# Patient Record
Sex: Male | Born: 2000 | Race: White | Hispanic: No | Marital: Single | Smoking: Current some day smoker
Health system: Southern US, Community
[De-identification: ages and names within clinical notes are randomized; demographics above are authoritative.]

## PROBLEM LIST (undated history)

## (undated) DIAGNOSIS — J45909 Unspecified asthma, uncomplicated: Secondary | ICD-10-CM

## (undated) HISTORY — PX: NO PAST SURGERIES: SHX2092

---
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
## 2018-05-04 IMAGING — CR DG CHEST 2V
2 series · 3 of 3 positions shown · non-contrast
Comparison: Chest x-ray dated 01/13/2013.

CLINICAL DATA: Shortness of breath x 4 days. Hx of Chest pain 2
years ago but was told growing pain. Hx asthma with exercise

EXAM:
CHEST  2 VIEW

[Series 1: chest pa · 0.14mm/px · 2 of 2 slices shown]
[im 1/2]
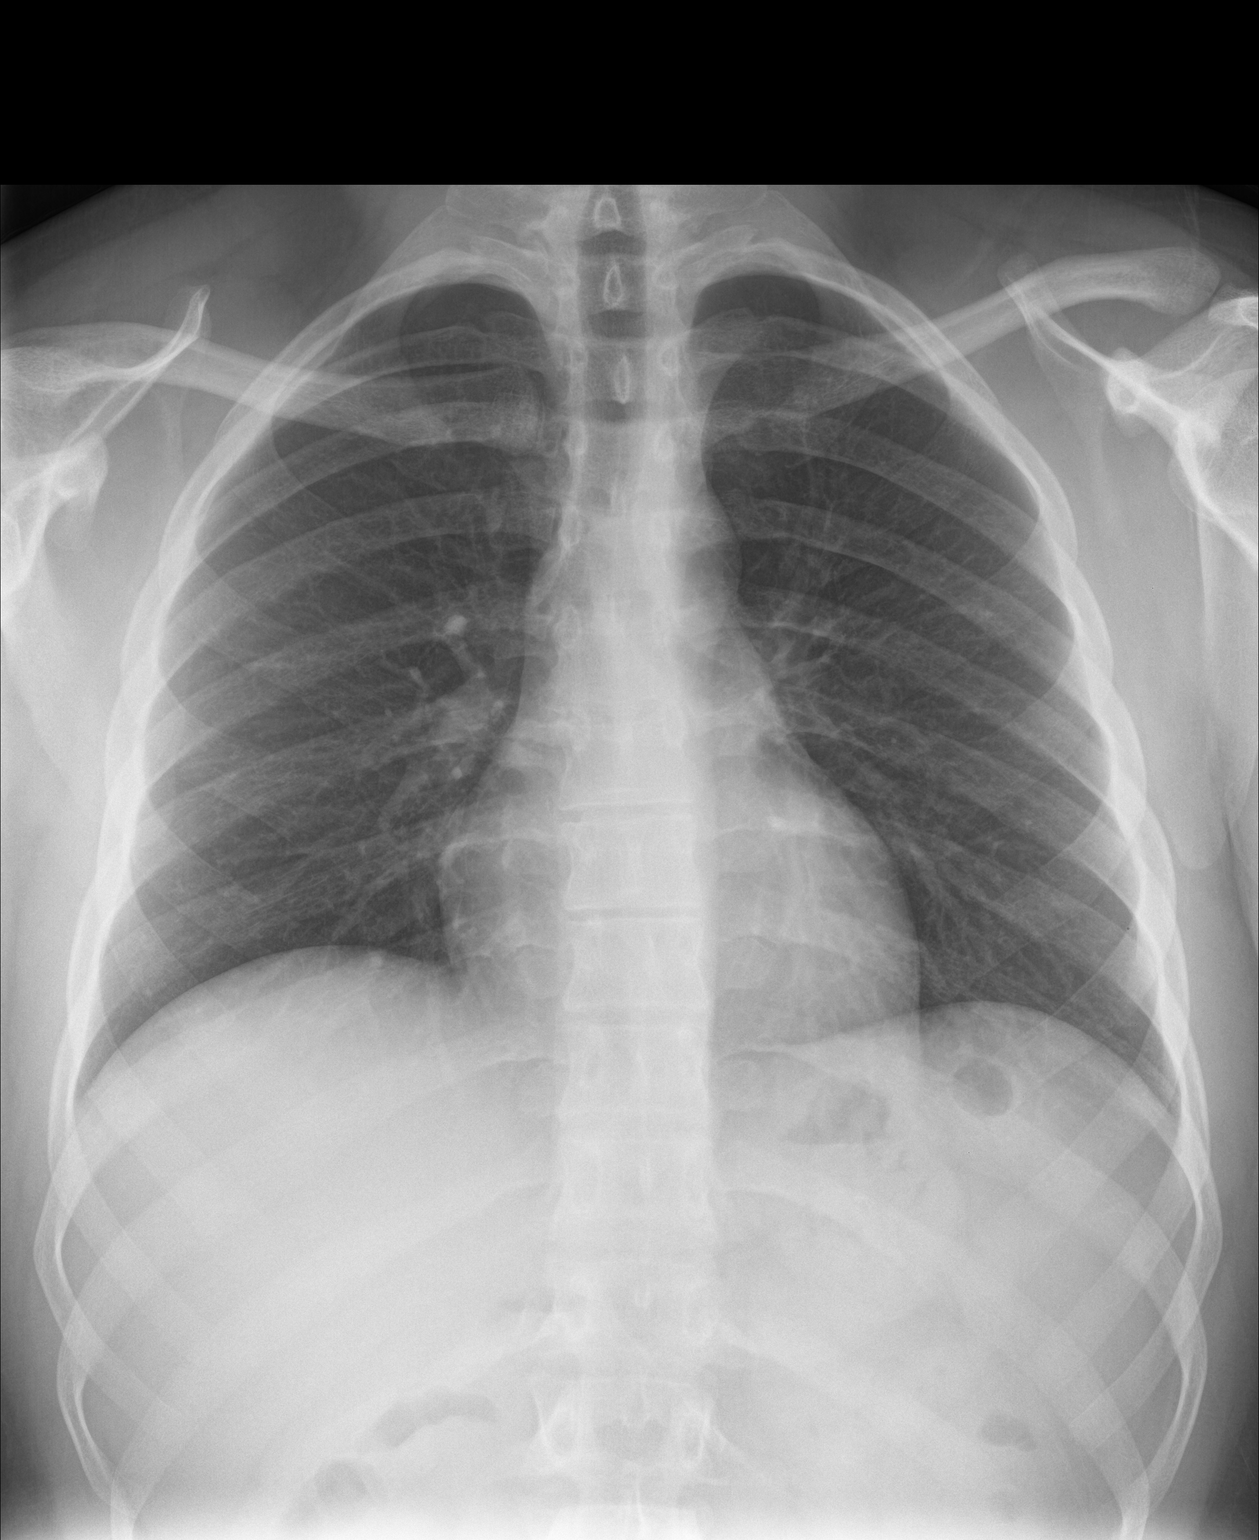
[im 2/2]
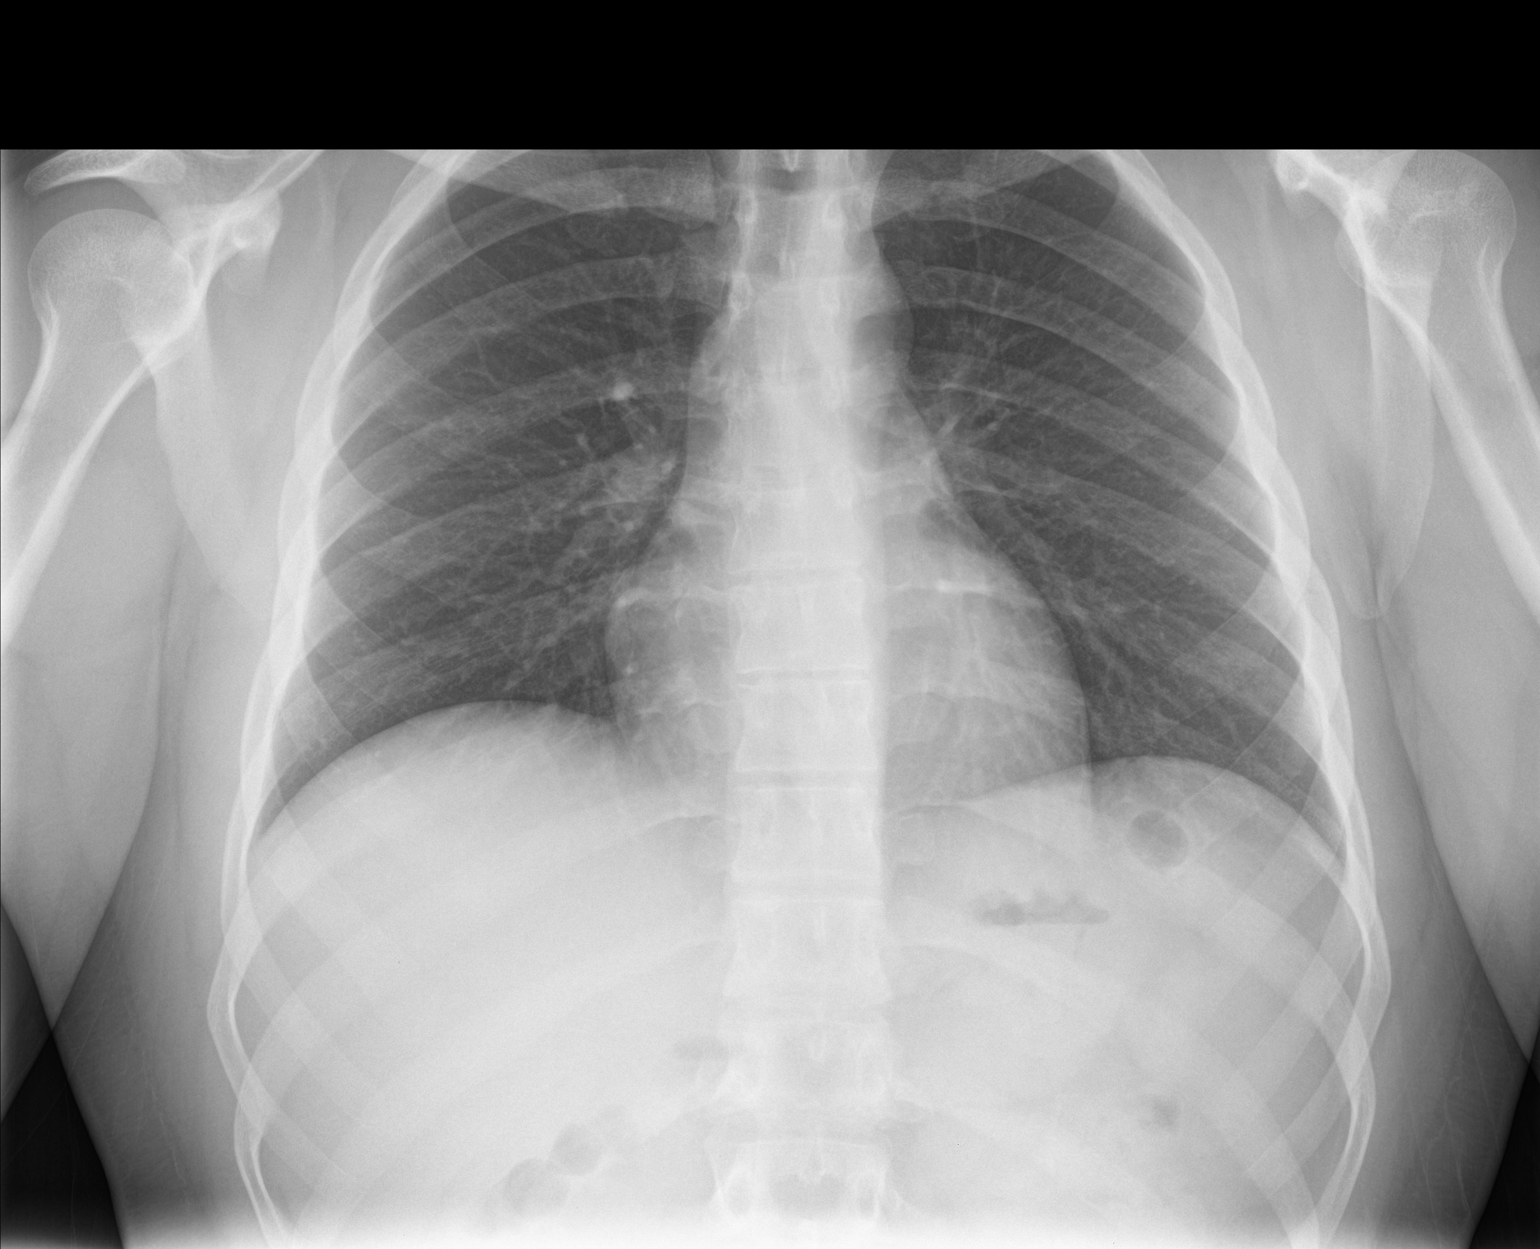

[chest lat]
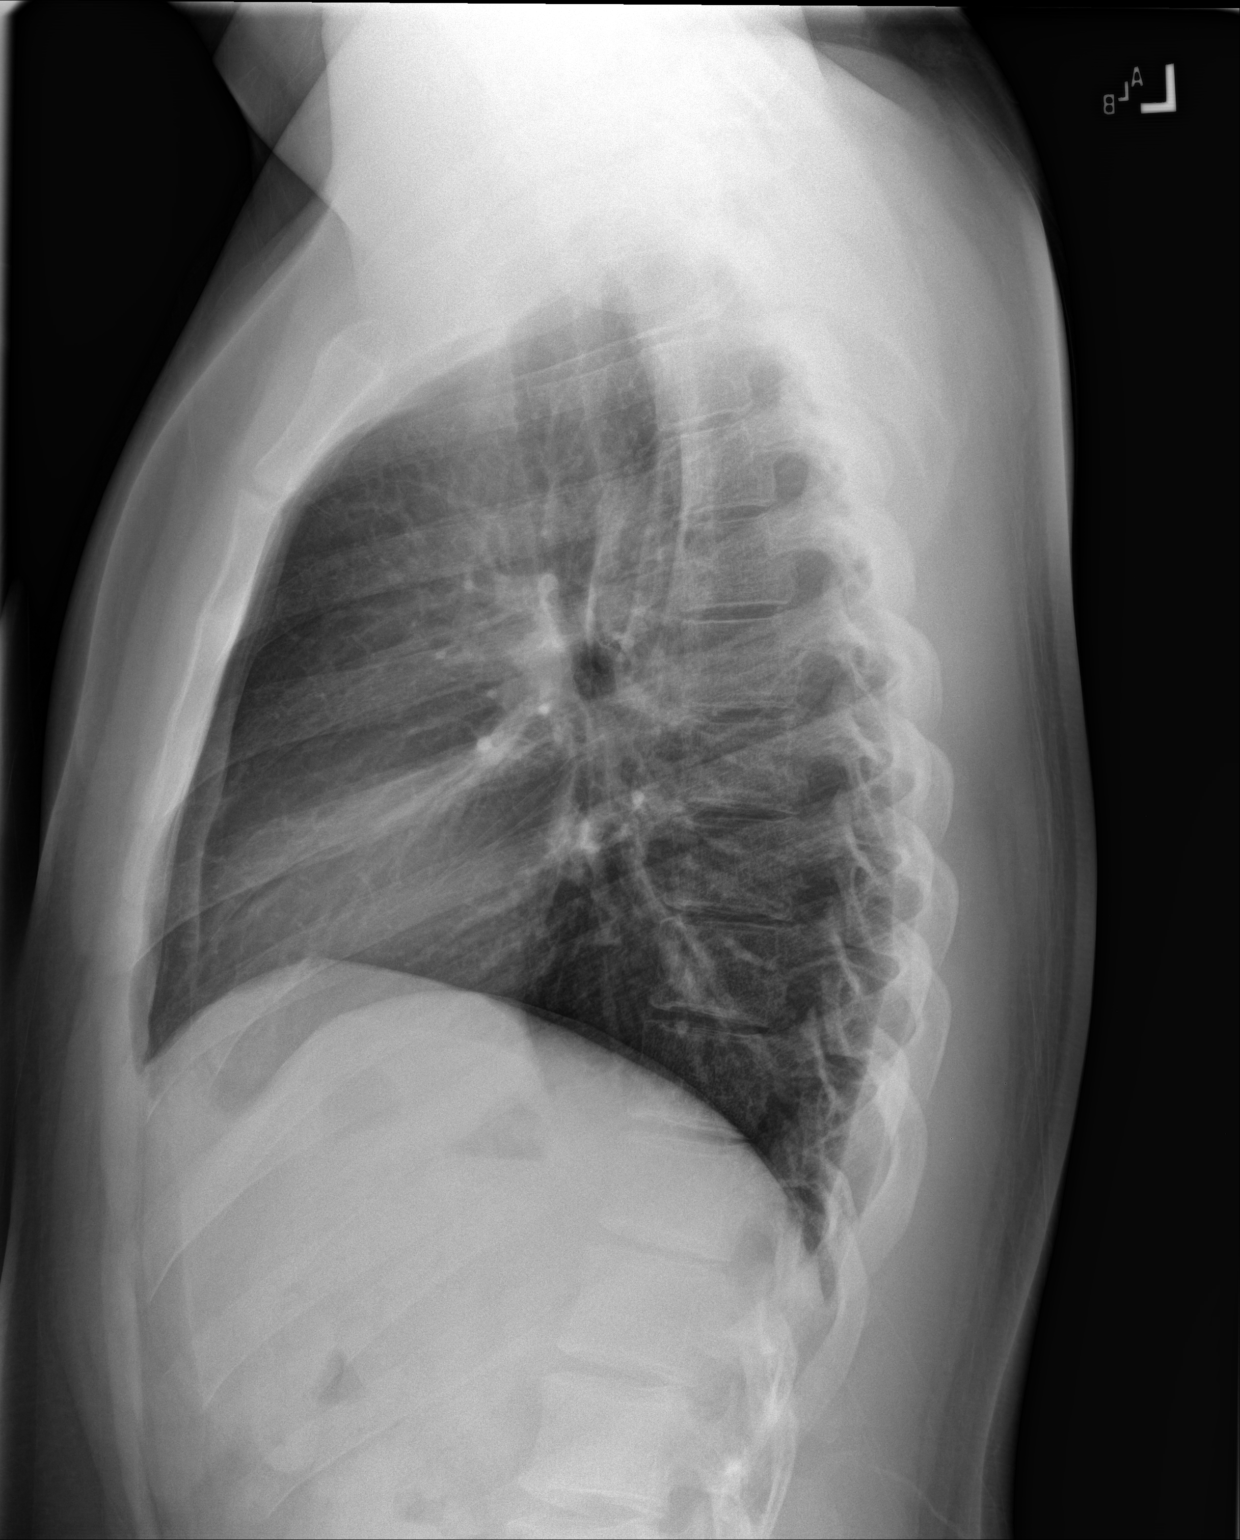

[3 of 3 positions shown; findings below may reference images not displayed]

FINDINGS: Cardiomediastinal silhouette is normal in size and configuration.
Lungs are clear. Lung volumes are normal. No evidence of pneumonia.
No pleural effusion. No pneumothorax seen.

Osseous and soft tissue structures about the chest are unremarkable.
IMPRESSION: Normal chest x-ray.

## 2018-12-12 ENCOUNTER — Ambulatory Visit
Admission: EM | Admit: 2018-12-12 | Discharge: 2018-12-12 | Disposition: A | Discharge status: Home / Self Care | Payer: Medicaid Other | Attending: Family Medicine | Admitting: Family Medicine

## 2018-12-12 ENCOUNTER — Encounter: Payer: Self-pay | Admitting: Emergency Medicine

## 2018-12-12 ENCOUNTER — Other Ambulatory Visit: Payer: Self-pay

## 2018-12-12 DIAGNOSIS — L03114 Cellulitis of left upper limb: Secondary | ICD-10-CM

## 2018-12-12 MED ORDER — SULFAMETHOXAZOLE-TRIMETHOPRIM 800-160 MG PO TABS: 1.0000 | ORAL_TABLET | Freq: Two times a day (BID) | ORAL | 0 refills | Status: DC

## 2018-12-12 NOTE — Discharge Instructions (Signed)
Elevate and warm compresses to area

## 2018-12-12 NOTE — ED Provider Notes (Signed)
[facility]    CSN: [id number] Arrival date & time: 12/12/18  1447     History   Chief Complaint Chief Complaint  Patient presents with  . Rash    HPI Dale Mckee is a 18 y.o. male.   18 yo male with a c/o left axilla rash, pain and left thumb and index finger rash and pain. Denies any fevers, chills.   The history is provided by the patient.  Rash    Past Medical History:  Diagnosis Date  . Asthma     There are no active problems to display for this patient.   Past Surgical History:  Procedure Laterality Date  . NO PAST SURGERIES         Home Medications    Prior to Admission medications   Medication Sig Start Date End Date Taking? Authorizing Provider  albuterol (PROVENTIL HFA;VENTOLIN HFA) 108 (90 Base) MCG/ACT inhaler Inhale 2 puffs into the lungs every 4 (four) hours as needed. 05/27/17   Renford Dills, NP  ondansetron (ZOFRAN ODT) 8 MG disintegrating tablet Take 1 tablet (8 mg total) by mouth every 8 (eight) hours as needed. 08/14/17   Payton Mccallum, MD  sulfamethoxazole-trimethoprim (BACTRIM DS,SEPTRA DS) 800-160 MG tablet Take 1 tablet by mouth 2 (two) times daily. 12/12/18   Payton Mccallum, MD    Family History Family History  Problem Relation Age of Onset  . Asthma Mother   . Hypertension Father   . CAD Father     Social History Social History   Tobacco Use  . Smoking status: Current Some Day Smoker    Packs/day: 0.50    Types: Cigarettes  . Smokeless tobacco: Current User  Substance Use Topics  . Alcohol use: No  . Drug use: No     Allergies   Patient has no known allergies.   Review of Systems Review of Systems  Skin: Positive for rash.     Physical Exam Triage Vital Signs ED Triage Vitals  Enc Vitals Group     BP 12/12/18 1518 (!) 141/99     Pulse Rate 12/12/18 1518 (!) 103     Resp 12/12/18 1518 18     Temp 12/12/18 1518 98.1 F (36.7 C)     Temp Source 12/12/18 1518 Oral     SpO2 12/12/18  1518 98 %     Weight 12/12/18 1515 225 lb (102.1 kg)     Height 12/12/18 1515 5\' 7"  (1.702 m)     Head Circumference --      Peak Flow --      Pain Score 12/12/18 1515 3     Pain Loc --      Pain Edu? --      Excl. in GC? --    No data found.  Updated Vital Signs BP (!) 141/99 (BP Location: Left Arm)   Pulse (!) 103   Temp 98.1 F (36.7 C) (Oral)   Resp 18   Ht 5\' 7"  (1.702 m)   Wt 102.1 kg   SpO2 98%   BMI 35.24 kg/m   Visual Acuity Right Eye Distance:   Left Eye Distance:   Bilateral Distance:    Right Eye Near:   Left Eye Near:    Bilateral Near:     Physical Exam Vitals signs and nursing note reviewed.  Constitutional:      General: Dale Mckee is not in acute distress.    Appearance: Dale Mckee is not toxic-appearing or diaphoretic.  Musculoskeletal:  Comments: Left axilla with area of 3-4cm  blanchable erythema, warmth and tenderness to palpation; 1cm area of erythema to left thumb and index finger; no blisters, vesicles, abscess or boils.   Neurological:     Mental Status: Dale Mckee is alert.      UC Treatments / Results  Labs (all labs ordered are listed, but only abnormal results are displayed) Labs Reviewed - No data to display  EKG None  Radiology No results found.  Procedures Procedures (including critical care time)  Medications Ordered in UC Medications - No data to display  Initial Impression / Assessment and Plan / UC Course  I have reviewed the triage vital signs and the nursing notes.  Pertinent labs & imaging results that were available during my care of the patient were reviewed by me and considered in my medical decision making (see chart for details).      Final Clinical Impressions(s) / UC Diagnoses   Final diagnoses:  Cellulitis of left arm     Discharge Instructions     Elevate and warm compresses to area    ED Prescriptions    Medication Sig Dispense Auth. Provider   sulfamethoxazole-trimethoprim (BACTRIM DS,SEPTRA DS) 800-160  MG tablet Take 1 tablet by mouth 2 (two) times daily. 20 tablet Payton Mccallumonty, Baleria Wyman, MD     1. diagnosis reviewed with patient 2. rx as per orders above; reviewed possible side effects, interactions, risks and benefits  3. Recommend supportive treatment as above 4. Follow-up prn if symptoms worsen or don't improve Controlled Substance Prescriptions [place] Controlled Substance Registry consulted? Not Applicable   Payton Mccallumonty, Taraji Mungo, MD 12/12/18 [phone]

## 2018-12-12 NOTE — ED Triage Notes (Signed)
Pt c/o rash in his axiliary region and on his fingers. Rash is tender but does not itch. He woke up yesterday and his arm was numb

## 2022-08-02 ENCOUNTER — Encounter: Payer: Self-pay | Admitting: Emergency Medicine

## 2022-08-02 ENCOUNTER — Ambulatory Visit
Admission: EM | Admit: 2022-08-02 | Discharge: 2022-08-02 | Disposition: A | Discharge status: Home / Self Care | Payer: Medicaid Other | Attending: Physician Assistant | Admitting: Physician Assistant

## 2022-08-02 DIAGNOSIS — R112 Nausea with vomiting, unspecified: Secondary | ICD-10-CM

## 2022-08-02 DIAGNOSIS — J029 Acute pharyngitis, unspecified: Secondary | ICD-10-CM | POA: Diagnosis not present

## 2022-08-02 DIAGNOSIS — Z1152 Encounter for screening for COVID-19: Secondary | ICD-10-CM | POA: Insufficient documentation

## 2022-08-02 LAB — RESP PANEL BY RT-PCR (FLU A&B, COVID) ARPGX2
Influenza A by PCR: NEGATIVE
Influenza B by PCR: NEGATIVE
SARS Coronavirus 2 by RT PCR: NEGATIVE

## 2022-08-02 LAB — GROUP A STREP BY PCR: Group A Strep by PCR: NOT DETECTED

## 2022-08-02 MED ORDER — ONDANSETRON 4 MG PO TBDP: 4.0000 mg | ORAL_TABLET | Freq: Three times a day (TID) | ORAL | 0 refills | Status: DC | PRN

## 2022-08-02 MED ORDER — CETIRIZINE HCL 10 MG PO TABS: 10.0000 mg | ORAL_TABLET | Freq: Every day | ORAL | 0 refills | Status: AC

## 2022-08-02 NOTE — Discharge Instructions (Addendum)
-  Swabs for strep throat, COVID, and flu were negative. -Symptoms likely seasonal allergy related from drainage.  As below Flonase and Zofran can help with the drainage and congestion which will help with the sore throat and the vomiting most likely. -Zofran: Paste 1 tablet under the tongue every 8 hours allowed to dissolve as needed for nausea -Push fluids -Bland diet as tolerated.  Additional information attached -Zyrtec: 1 tablet daily.  This will help with the congestion and drainage that may be at least partially cause for your sore throat.  Vomiting may be related to drainage as well. -Can also use Flonase to help with congestion symptoms. -Follow-up with primary care as needed

## 2022-08-02 NOTE — ED Provider Notes (Signed)
[facility]    CSN: [id number] Arrival date & time: 08/02/22  1427      History   Chief Complaint Chief Complaint  Patient presents with   Emesis   Sore Throat    HPI Dale Mckee is a 21 y.o. male.   Patient is a 21 year old male who presents with chief complaint of 1 week of sore throat.  Additionally patient states he has woke up with swollen uvula had some vomiting last night.  States he last tried to eat something this morning.  Patient reported some nausea as well as cold sweats.  He states he has not taken anything for his symptomshad a swollen uvula.  Patient reports some sick contacts at work but not sure what they have.  Patient denies any fever, chest pain, or shortness of breath.    Past Medical History:  Diagnosis Date   Asthma     There are no problems to display for this patient.   Past Surgical History:  Procedure Laterality Date   NO PAST SURGERIES         Home Medications    Prior to Admission medications   Medication Sig Start Date End Date Taking? Authorizing Provider  cetirizine (ZYRTEC) 10 MG tablet Take 1 tablet (10 mg total) by mouth daily. 08/02/22  Yes Candis Schatz, PA-C  ondansetron (ZOFRAN-ODT) 4 MG disintegrating tablet Take 1 tablet (4 mg total) by mouth every 8 (eight) hours as needed for nausea or vomiting. 08/02/22  Yes Candis Schatz, PA-C  albuterol (PROVENTIL HFA;VENTOLIN HFA) 108 (90 Base) MCG/ACT inhaler Inhale 2 puffs into the lungs every 4 (four) hours as needed. 05/27/17   Renford Dills, NP  ondansetron (ZOFRAN ODT) 8 MG disintegrating tablet Take 1 tablet (8 mg total) by mouth every 8 (eight) hours as needed. 08/14/17   Payton Mccallum, MD  sulfamethoxazole-trimethoprim (BACTRIM DS,SEPTRA DS) 800-160 MG tablet Take 1 tablet by mouth 2 (two) times daily. 12/12/18   Payton Mccallum, MD    Family History Family History  Problem Relation Age of Onset   Asthma Mother    Hypertension Father    CAD  Father     Social History Social History   Tobacco Use   Smoking status: Some Days    Packs/day: 0.50    Types: Cigarettes   Smokeless tobacco: Current  Vaping Use   Vaping Use: Every day  Substance Use Topics   Alcohol use: No   Drug use: Yes    Types: Marijuana     Allergies   Patient has no known allergies.   Review of Systems Review of Systems as noted above in HPI.  Other systems reviewed and found to be negative   Physical Exam Triage Vital Signs ED Triage Vitals  Enc Vitals Group     BP 08/02/22 1454 123/72     Pulse Rate 08/02/22 1454 (!) 103     Resp 08/02/22 1454 16     Temp 08/02/22 1454 98.8 F (37.1 C)     Temp Source 08/02/22 1454 Oral     SpO2 08/02/22 1454 98 %     Weight 08/02/22 1453 225 lb 1.4 oz (102.1 kg)     Height 08/02/22 1453 5\' 7"  (1.702 m)     Head Circumference --      Peak Flow --      Pain Score 08/02/22 1451 6     Pain Loc --      Pain Edu? --  Excl. in GC? --    No data found.  Updated Vital Signs BP 123/72 (BP Location: Left Arm)   Pulse (!) 103   Temp 98.8 F (37.1 C) (Oral)   Resp 16   Ht 5\' 7"  (1.702 m)   Wt 225 lb 1.4 oz (102.1 kg)   SpO2 98%   BMI 35.25 kg/m   Visual Acuity Right Eye Distance:   Left Eye Distance:   Bilateral Distance:    Right Eye Near:   Left Eye Near:    Bilateral Near:     Physical Exam Constitutional:      Appearance: He is well-developed. He is not ill-appearing.  HENT:     Head: Normocephalic.     Right Ear: Tympanic membrane and ear canal normal.     Left Ear: Tympanic membrane and ear canal normal.     Mouth/Throat:     Pharynx: Uvula midline. Posterior oropharyngeal erythema (mild) present.     Tonsils: 2+ on the right. 2+ on the left.     Comments: Clear post-nasal drainage Eyes:     Conjunctiva/sclera: Conjunctivae normal.  Cardiovascular:     Rate and Rhythm: Normal rate and regular rhythm.  Pulmonary:     Effort: Pulmonary effort is normal.     Breath  sounds: Normal breath sounds. No wheezing or rhonchi.  Abdominal:     Palpations: Abdomen is soft.  Musculoskeletal:     Cervical back: Normal range of motion.  Neurological:     Mental Status: He is alert.      UC Treatments / Results  Labs (all labs ordered are listed, but only abnormal results are displayed) Labs Reviewed  GROUP A STREP BY PCR  RESP PANEL BY RT-PCR (FLU A&B, COVID) [id number]    EKG   Radiology No results found.  Procedures Procedures (including critical care time)  Medications Ordered in UC Medications - No data to display  Initial Impression / Assessment and Plan / UC Course  I have reviewed the triage vital signs and the nursing notes.  Pertinent labs & imaging results that were available during my care of the patient were reviewed by me and considered in my medical decision making (see chart for details).    Patient presents with 1 week of sore throat, vomiting started last night as well as some cold sweats.  Patient also reports occasional waking with a swollen uvula.  Patient denies any fever, chest pains, shortness of breath.  Patient has not taken anything for his symptoms.  Will check a strep and viral swabs.  Viral panel and strep swabs were negative.  Give patient prescription for Zofran for his nausea.  Also given prescription for Zyrtec taking 1 daily to help with the drainage.  Also recommend Flonase.  Recommend pushing fluids and bland diet as tolerated Final Clinical Impressions(s) / UC Diagnoses   Final diagnoses:  Nausea and vomiting, unspecified vomiting type  Sore throat     Discharge Instructions      -Swabs for strep throat, COVID, and flu were negative. -Symptoms likely seasonal allergy related from drainage.  As below Flonase and Zofran can help with the drainage and congestion which will help with the sore throat and the vomiting most likely. -Zofran: Paste 1 tablet under the tongue every 8 hours allowed to dissolve as  needed for nausea -Push fluids -Bland diet as tolerated.  Additional information attached -Zyrtec: 1 tablet daily.  This will help with the congestion and drainage that may  be at least partially cause for your sore throat.  Vomiting may be related to drainage as well. -Can also use Flonase to help with congestion symptoms. -Follow-up with primary care as needed     ED Prescriptions     Medication Sig Dispense Auth. Provider   ondansetron (ZOFRAN-ODT) 4 MG disintegrating tablet Take 1 tablet (4 mg total) by mouth every 8 (eight) hours as needed for nausea or vomiting. 20 tablet Candis Schatz, PA-C   cetirizine (ZYRTEC) 10 MG tablet Take 1 tablet (10 mg total) by mouth daily. 30 tablet Candis Schatz, PA-C      PDMP not reviewed this encounter.   Candis Schatz, PA-C 08/02/22 1543

## 2022-08-02 NOTE — ED Triage Notes (Signed)
Pt c/o nausea, vomiting, sore throat. Sore throat started a couple of days ago and vomiting started last night. Denies fever.

## 2022-08-24 ENCOUNTER — Ambulatory Visit
Admission: EM | Admit: 2022-08-24 | Discharge: 2022-08-24 | Disposition: A | Discharge status: Home / Self Care | Payer: Medicaid Other | Attending: Family Medicine | Admitting: Family Medicine

## 2022-08-24 DIAGNOSIS — J111 Influenza due to unidentified influenza virus with other respiratory manifestations: Secondary | ICD-10-CM | POA: Diagnosis not present

## 2022-08-24 NOTE — ED Provider Notes (Signed)
[facility]    CSN: [id number] Arrival date & time: 08/24/22  1356      History   Chief Complaint Chief Complaint  Patient presents with   Generalized Body Aches   Sore Throat    HPI Dale Mckee is a 21 y.o. male.   HPI   Dale Mckee presents for cough, fatigue, body aches, sore throat, headache, diarrhea, vomiting.  No known fever.  He has been sleeping more and eating less but staying hydrated.  No known sick contacts but works with the public.       Past Medical History:  Diagnosis Date   Asthma     There are no problems to display for this patient.   Past Surgical History:  Procedure Laterality Date   NO PAST SURGERIES         Home Medications    Prior to Admission medications   Medication Sig Start Date End Date Taking? Authorizing Provider  albuterol (PROVENTIL HFA;VENTOLIN HFA) 108 (90 Base) MCG/ACT inhaler Inhale 2 puffs into the lungs every 4 (four) hours as needed. 05/27/17   Renford Dills, NP  cetirizine (ZYRTEC) 10 MG tablet Take 1 tablet (10 mg total) by mouth daily. 08/02/22   Candis Schatz, PA-C  ondansetron (ZOFRAN ODT) 8 MG disintegrating tablet Take 1 tablet (8 mg total) by mouth every 8 (eight) hours as needed. 08/14/17   Payton Mccallum, MD  ondansetron (ZOFRAN-ODT) 4 MG disintegrating tablet Take 1 tablet (4 mg total) by mouth every 8 (eight) hours as needed for nausea or vomiting. 08/02/22   Candis Schatz, PA-C  sulfamethoxazole-trimethoprim (BACTRIM DS,SEPTRA DS) 800-160 MG tablet Take 1 tablet by mouth 2 (two) times daily. 12/12/18   Payton Mccallum, MD    Family History Family History  Problem Relation Age of Onset   Asthma Mother    Hypertension Father    CAD Father     Social History Social History   Tobacco Use   Smoking status: Some Days    Packs/day: 0.50    Types: Cigarettes   Smokeless tobacco: Former  Building services engineer Use: Every day  Substance Use Topics   Alcohol use: No   Drug  use: Yes    Types: Marijuana     Allergies   Patient has no known allergies.   Review of Systems Review of Systems: negative unless otherwise stated in HPI.      Physical Exam Triage Vital Signs ED Triage Vitals  Enc Vitals Group     BP 08/24/22 1535 104/64     Pulse Rate 08/24/22 1535 89     Resp 08/24/22 1535 18     Temp 08/24/22 1535 97.7 F (36.5 C)     Temp Source 08/24/22 1535 Oral     SpO2 08/24/22 1535 96 %     Weight 08/24/22 1533 170 lb (77.1 kg)     Height 08/24/22 1533 5\' 8"  (1.727 m)     Head Circumference --      Peak Flow --      Pain Score 08/24/22 1533 7     Pain Loc --      Pain Edu? --      Excl. in GC? --    No data found.  Updated Vital Signs BP 104/64 (BP Location: Left Arm)   Pulse 89   Temp 97.7 F (36.5 C) (Oral)   Resp 18   Ht 5\' 8"  (1.727 m)   Wt 77.1 kg  SpO2 96%   BMI 25.85 kg/m   Visual Acuity Right Eye Distance:   Left Eye Distance:   Bilateral Distance:    Right Eye Near:   Left Eye Near:    Bilateral Near:     Physical Exam GEN:     alert, non-toxic appearing male in no distress    HENT:  mucus membranes moist, oropharyngeal without lesions or exudate, 2+ tonsillar hypertrophy,  mild oropharyngeal erythema ,  moderate erythematous edematous turbinates, clear nasal discharge, bilateral TM normal EYES:   pupils equal and reactive, no scleral injection or discharge NECK:  normal ROM, no meningismus   RESP:  no increased work of breathing, clear to auscultation bilaterally CVS:   regular rate and rhythm Skin:   warm and dry    UC Treatments / Results  Labs (all labs ordered are listed, but only abnormal results are displayed) Labs Reviewed - No data to display  EKG   Radiology No results found.  Procedures Procedures (including critical care time)  Medications Ordered in UC Medications - No data to display  Initial Impression / Assessment and Plan / UC Course  I have reviewed the triage vital signs and  the nursing notes.  Pertinent labs & imaging results that were available during my care of the patient were reviewed by me and considered in my medical decision making (see chart for details).       Pt is a 21 y.o. male who presents for 5 days of respiratory symptoms. Dale Mckee is afebrile here without recent antipyretics. Satting well on room air. Overall pt is non-toxic appearing, well hydrated, without respiratory distress. Pulmonary exam is unremarkable.  COVID and influenza testing deferred due to duration of symptoms. History consistent with viral respiratory illness. Discussed symptomatic treatment.  Explained lack of efficacy of antibiotics in viral disease.  Typical duration of symptoms discussed. Work note provided.   Return and ED precautions given and voiced understanding. Discussed MDM, treatment plan and plan for follow-up with patient who agrees with plan.     Final Clinical Impressions(s) / UC Diagnoses   Final diagnoses:  Influenza-like illness     Discharge Instructions      You have flu-like symptoms.  At this time it is not recommended that your be tested or treated for the flu.   If your were prescribed medication, stop by the pharmacy to pick them up.   You can take Tylenol and/or Ibuprofen as needed for fever reduction and pain relief.    For cough: honey 1/2 to 1 teaspoon (you can dilute the honey in water or another fluid).  You can also use guaifenesin and dextromethorphan for cough. You can use a humidifier for chest congestion and cough.  If you don't have a humidifier, you can sit in the bathroom with the hot shower running.      For sore throat: try warm salt water gargles, Mucinex sore throat cough drops or cepacol lozenges, throat spray, warm tea or water with lemon/honey, popsicles or ice, or OTC cold relief medicine for throat discomfort. You can also purchase chloraseptic spray at the pharmacy or dollar store.   For congestion: take a daily  anti-histamine like Zyrtec, Claritin, and a oral decongestant, such as pseudoephedrine.  You can also use Flonase 1-2 sprays in each nostril daily. Afrin is also a good option, if you do not have high blood pressure.    It is important to stay hydrated: drink plenty of fluids (water, gatorade/powerade/pedialyte, juices, or  teas) to keep your throat moisturized and help further relieve irritation/discomfort.    Return or go to the Emergency Department if symptoms worsen or do not improve in the next few days      ED Prescriptions   None    PDMP not reviewed this encounter.   Katha Cabal, DO 08/24/22 1642

## 2022-08-24 NOTE — ED Triage Notes (Signed)
Pt c/o fatigue, Headache, body aches, vomiting, sore throat x5days nasal congestion x1day

## 2022-08-24 NOTE — Discharge Instructions (Addendum)
You have flu-like symptoms.  At this time it is not recommended that your be tested or treated for the flu.   If your were prescribed medication, stop by the pharmacy to pick them up.   You can take Tylenol and/or Ibuprofen as needed for fever reduction and pain relief.    For cough: honey 1/2 to 1 teaspoon (you can dilute the honey in water or another fluid).  You can also use guaifenesin and dextromethorphan for cough. You can use a humidifier for chest congestion and cough.  If you don't have a humidifier, you can sit in the bathroom with the hot shower running.      For sore throat: try warm salt water gargles, Mucinex sore throat cough drops or cepacol lozenges, throat spray, warm tea or water with lemon/honey, popsicles or ice, or OTC cold relief medicine for throat discomfort. You can also purchase chloraseptic spray at the pharmacy or dollar store.   For congestion: take a daily anti-histamine like Zyrtec, Claritin, and a oral decongestant, such as pseudoephedrine.  You can also use Flonase 1-2 sprays in each nostril daily. Afrin is also a good option, if you do not have high blood pressure.    It is important to stay hydrated: drink plenty of fluids (water, gatorade/powerade/pedialyte, juices, or teas) to keep your throat moisturized and help further relieve irritation/discomfort.    Return or go to the Emergency Department if symptoms worsen or do not improve in the next few days

## 2022-10-08 ENCOUNTER — Ambulatory Visit
Admission: EM | Admit: 2022-10-08 | Discharge: 2022-10-08 | Disposition: A | Discharge status: Home / Self Care | Payer: Medicaid Other | Attending: Family Medicine | Admitting: Family Medicine

## 2022-10-08 DIAGNOSIS — J209 Acute bronchitis, unspecified: Secondary | ICD-10-CM

## 2022-10-08 MED ORDER — OMEPRAZOLE 40 MG PO CPDR: 40.0000 mg | DELAYED_RELEASE_CAPSULE | Freq: Every day | ORAL | 0 refills | Status: AC

## 2022-10-08 MED ORDER — AZITHROMYCIN 250 MG PO TABS: 250.0000 mg | ORAL_TABLET | Freq: Every day | ORAL | 0 refills | Status: DC

## 2022-10-08 MED ORDER — CEFDINIR 300 MG PO CAPS: 300.0000 mg | ORAL_CAPSULE | Freq: Two times a day (BID) | ORAL | 0 refills | Status: AC

## 2022-10-08 NOTE — Discharge Instructions (Signed)
Stop by the pharmacy to pick up your prescriptions.  Follow up with your primary care provider as needed.  Return if cough and abdominal pain is not improving. If pain worsens, go to the emergency department as you may need advanced imaging.

## 2022-10-08 NOTE — ED Provider Notes (Signed)
[facility]    CSN: [id number] Arrival date & time: 10/08/22  1609      History   Chief Complaint Chief Complaint  Patient presents with   Hemoptysis   Nausea   Abdominal Pain    HPI Dale Mckee is a 22 y.o. male.   HPI   Dale Mckee presents for cough since Christmas. He had the flu around that time. The past 2-3 weeks, he has been coughing up bright-red blood and darker brown colored sputum. Has nausea but no vomiting. Has abdominal pain "throbbing burning" that radiates upward.  Has chest pain with deep breathing.  Took nothing for his symptoms.  Denies fever. Endorses headaches.   Denies history of asthma.     Past Medical History:  Diagnosis Date   Asthma     There are no problems to display for this patient.   Past Surgical History:  Procedure Laterality Date   NO PAST SURGERIES         Home Medications    Prior to Admission medications   Medication Sig Start Date End Date Taking? Authorizing Provider  albuterol (PROVENTIL HFA;VENTOLIN HFA) 108 (90 Base) MCG/ACT inhaler Inhale 2 puffs into the lungs every 4 (four) hours as needed. 05/27/17  Yes Marylene Land, NP  cetirizine (ZYRTEC) 10 MG tablet Take 1 tablet (10 mg total) by mouth daily. 08/02/22  Yes Luvenia Redden, PA-C  ondansetron (ZOFRAN ODT) 8 MG disintegrating tablet Take 1 tablet (8 mg total) by mouth every 8 (eight) hours as needed. 08/14/17  Yes Norval Gable, MD  ondansetron (ZOFRAN-ODT) 4 MG disintegrating tablet Take 1 tablet (4 mg total) by mouth every 8 (eight) hours as needed for nausea or vomiting. 08/02/22  Yes Luvenia Redden, PA-C  sulfamethoxazole-trimethoprim (BACTRIM DS,SEPTRA DS) 800-160 MG tablet Take 1 tablet by mouth 2 (two) times daily. 12/12/18  Yes Norval Gable, MD    Family History Family History  Problem Relation Age of Onset   Asthma Mother    Hypertension Father    CAD Father     Social History Social History   Tobacco Use   Smoking  status: Some Days    Packs/day: 0.50    Types: Cigarettes   Smokeless tobacco: Former  Scientific laboratory technician Use: Every day  Substance Use Topics   Alcohol use: No   Drug use: Yes    Types: Marijuana     Allergies   Patient has no known allergies.   Review of Systems Review of Systems: negative unless otherwise stated in HPI.      Physical Exam Triage Vital Signs ED Triage Vitals  Enc Vitals Group     BP 10/08/22 1656 125/85     Pulse Rate 10/08/22 1656 94     Resp 10/08/22 1656 16     Temp 10/08/22 1656 98.8 F (37.1 C)     Temp Source 10/08/22 1656 Oral     SpO2 10/08/22 1656 94 %     Weight 10/08/22 1655 180 lb (81.6 kg)     Height 10/08/22 1655 5\' 7"  (1.702 m)     Head Circumference --      Peak Flow --      Pain Score 10/08/22 1655 6     Pain Loc --      Pain Edu? --      Excl. in [place]? --    No data found.  Updated Vital Signs BP 125/85 (BP Location: Left Arm)  Pulse 94   Temp 98.8 F (37.1 C) (Oral)   Resp 16   Ht 5\' 7"  (1.702 m)   Wt 81.6 kg   SpO2 94%   BMI 28.19 kg/m   Visual Acuity Right Eye Distance:   Left Eye Distance:   Bilateral Distance:    Right Eye Near:   Left Eye Near:    Bilateral Near:     Physical Exam GEN:     alert, non-toxic appearing male in no distress ***   HENT:  mucus membranes moist, oropharyngeal ***without lesions or ***exudate, no*** tonsillar hypertrophy, *** mild oropharyngeal erythema , *** moderate erythematous edematous turbinates, ***clear nasal discharge, ***bilateral TM normal EYES:   pupils equal and reactive, ***no scleral injection or discharge NECK:  normal ROM, no ***lymphadenopathy, ***no meningismus   RESP:  no increased work of breathing, ***clear to auscultation bilaterally CVS:   regular rate ***and rhythm Skin:   warm and dry, no rash on visible skin***    UC Treatments / Results  Labs (all labs ordered are listed, but only abnormal results are displayed) Labs Reviewed - No data to  display  EKG   Radiology No results found.  Procedures Procedures (including critical care time)  Medications Ordered in UC Medications - No data to display  Initial Impression / Assessment and Plan / UC Course  I have reviewed the triage vital signs and the nursing notes.  Pertinent labs & imaging results that were available during my care of the patient were reviewed by me and considered in my medical decision making (see chart for details).       Pt is a 22 y.o. male who presents for *** days of respiratory symptoms. Leib is ***afebrile here without recent antipyretics. Satting well on room air. Overall pt is ***non-toxic appearing, well hydrated, without respiratory distress. Pulmonary exam ***is unremarkable.  COVID and influenza testing obtained ***and was negative. ***Pt to quarantine until COVID test results or longer if positive.  I will call patient with test results, if positive. History consistent with ***viral respiratory illness. Discussed symptomatic treatment.  Explained lack of efficacy of antibiotics in viral disease.  Typical duration of symptoms discussed.   Return and ED precautions given and voiced understanding. Discussed MDM, treatment plan and plan for follow-up with patient/guardian*** who agrees with plan.     Final Clinical Impressions(s) / UC Diagnoses   Final diagnoses:  None   Discharge Instructions   None    ED Prescriptions   None    PDMP not reviewed this encounter.

## 2022-10-08 NOTE — ED Triage Notes (Signed)
Pt c/o hemoptysis size of a quarter happening through the day along w/nausea & abd pain x14 days. Denies any V/D trauma or injury to chest.

## 2023-02-19 ENCOUNTER — Ambulatory Visit
Admission: EM | Admit: 2023-02-19 | Discharge: 2023-02-19 | Disposition: A | Discharge status: Home / Self Care | Payer: Self-pay | Attending: Physician Assistant | Admitting: Physician Assistant

## 2023-02-19 DIAGNOSIS — R519 Headache, unspecified: Secondary | ICD-10-CM | POA: Insufficient documentation

## 2023-02-19 DIAGNOSIS — B349 Viral infection, unspecified: Secondary | ICD-10-CM | POA: Insufficient documentation

## 2023-02-19 DIAGNOSIS — R112 Nausea with vomiting, unspecified: Secondary | ICD-10-CM | POA: Insufficient documentation

## 2023-02-19 DIAGNOSIS — J029 Acute pharyngitis, unspecified: Secondary | ICD-10-CM | POA: Insufficient documentation

## 2023-02-19 LAB — GROUP A STREP BY PCR: Group A Strep by PCR: NOT DETECTED

## 2023-02-19 MED ORDER — NAPROXEN 375 MG PO TABS: 375.0000 mg | ORAL_TABLET | Freq: Two times a day (BID) | ORAL | 0 refills | Status: AC

## 2023-02-19 MED ORDER — ONDANSETRON 4 MG PO TBDP: 4.0000 mg | ORAL_TABLET | Freq: Four times a day (QID) | ORAL | 0 refills | Status: AC | PRN

## 2023-02-19 NOTE — ED Triage Notes (Signed)
Pt c/o chills,HA & nausea x1 wk. Denies any abd pain,diarrhea,emesis or fevers. No otc tx.

## 2023-02-19 NOTE — ED Provider Notes (Signed)
[facility]    CSN: [id number] Arrival date & time: 02/19/23  1815      History   Chief Complaint Chief Complaint  Patient presents with   Nausea   Chills   Headache    HPI Dale Mckee is a 22 y.o. male presenting for approximately 1 week history of fatigue, chills, cough, sore throat, generalized abdominal pain, nausea with a few episodes of vomiting and headaches.  Denies fever.  Says sore throat has resolved.  Denies congestion, sinus pain, ear pain, chest pain, shortness of breath.  Denies diarrhea.  Reports that his brother recently had a cold and a sinus infection.  He denies any known exposure to strep or COVID.  He reports he feels a little better than he did at onset but not much.  He has not been taking any OTC meds for symptoms.  He has no other complaints.   HPI  Past Medical History:  Diagnosis Date   Asthma     There are no problems to display for this patient.   Past Surgical History:  Procedure Laterality Date   NO PAST SURGERIES         Home Medications    Prior to Admission medications   Medication Sig Start Date End Date Taking? Authorizing Provider  naproxen (NAPROSYN) 375 MG tablet Take 1 tablet (375 mg total) by mouth 2 (two) times daily. 02/19/23  Yes Eusebio Friendly B, PA-C  ondansetron (ZOFRAN-ODT) 4 MG disintegrating tablet Take 1 tablet (4 mg total) by mouth every 6 (six) hours as needed for nausea or vomiting. 02/19/23  Yes Eusebio Friendly B, PA-C  albuterol (PROVENTIL HFA;VENTOLIN HFA) 108 (90 Base) MCG/ACT inhaler Inhale 2 puffs into the lungs every 4 (four) hours as needed. 05/27/17   Renford Dills, NP  cetirizine (ZYRTEC) 10 MG tablet Take 1 tablet (10 mg total) by mouth daily. 08/02/22   Candis Schatz, PA-C  omeprazole (PRILOSEC) 40 MG capsule Take 1 capsule (40 mg total) by mouth daily. 10/08/22   Katha Cabal, DO    Family History Family History  Problem Relation Age of Onset   Asthma Mother    Hypertension  Father    CAD Father     Social History Social History   Tobacco Use   Smoking status: Some Days    Packs/day: .5    Types: Cigarettes   Smokeless tobacco: Former  Building services engineer Use: Every day  Substance Use Topics   Alcohol use: No   Drug use: Yes    Types: Marijuana     Allergies   Patient has no known allergies.   Review of Systems Review of Systems  Constitutional:  Positive for chills and fatigue. Negative for appetite change and fever.  HENT:  Negative for congestion, ear pain, rhinorrhea, sinus pressure, sinus pain and sore throat.   Respiratory:  Positive for cough. Negative for shortness of breath and wheezing.   Cardiovascular:  Negative for chest pain.  Gastrointestinal:  Positive for abdominal pain, nausea and vomiting. Negative for diarrhea.  Musculoskeletal:  Negative for myalgias.  Neurological:  Positive for headaches. Negative for dizziness, weakness and light-headedness.  Hematological:  Negative for adenopathy.     Physical Exam Triage Vital Signs ED Triage Vitals  Enc Vitals Group     BP --      Pulse --      Resp 02/19/23 1829 16     Temp --  Temp Source 02/19/23 1829 Oral     SpO2 --      Weight 02/19/23 1828 210 lb (95.3 kg)     Height 02/19/23 1828 5\' 7"  (1.702 m)     Head Circumference --      Peak Flow --      Pain Score 02/19/23 1831 0     Pain Loc --      Pain Edu? --      Excl. in GC? --    No data found.  Updated Vital Signs BP 110/82 (BP Location: Left Arm)   Pulse 75   Temp 98.6 F (37 C) (Oral)   Resp 16   Ht 5\' 7"  (1.702 m)   Wt 210 lb (95.3 kg)   SpO2 98%   BMI 32.89 kg/m     Physical Exam Vitals and nursing note reviewed.  Constitutional:      General: He is not in acute distress.    Appearance: Normal appearance. He is well-developed. He is not ill-appearing.  HENT:     Head: Normocephalic and atraumatic.     Right Ear: Tympanic membrane, ear canal and external ear normal.     Left Ear:  Tympanic membrane, ear canal and external ear normal.     Nose: Nose normal.     Mouth/Throat:     Mouth: Mucous membranes are moist.     Pharynx: Posterior oropharyngeal erythema present.     Tonsils: 1+ on the right. 1+ on the left.  Eyes:     General: No scleral icterus.    Extraocular Movements: Extraocular movements intact.     Conjunctiva/sclera: Conjunctivae normal.     Pupils: Pupils are equal, round, and reactive to light.  Cardiovascular:     Rate and Rhythm: Normal rate and regular rhythm.     Heart sounds: Normal heart sounds.  Pulmonary:     Effort: Pulmonary effort is normal. No respiratory distress.     Breath sounds: Normal breath sounds.  Abdominal:     Palpations: Abdomen is soft.     Tenderness: There is abdominal tenderness (LUQ, epigastric).  Musculoskeletal:     Cervical back: Neck supple.  Skin:    General: Skin is warm and dry.     Capillary Refill: Capillary refill takes less than 2 seconds.  Neurological:     General: No focal deficit present.     Mental Status: He is alert and oriented to person, place, and time. Mental status is at baseline.     Motor: No weakness.     Coordination: Coordination normal.     Gait: Gait normal.  Psychiatric:        Mood and Affect: Mood normal.        Behavior: Behavior normal.      UC Treatments / Results  Labs (all labs ordered are listed, but only abnormal results are displayed) Labs Reviewed  GROUP A STREP BY PCR    EKG   Radiology No results found.  Procedures Procedures (including critical care time)  Medications Ordered in UC Medications - No data to display  Initial Impression / Assessment and Plan / UC Course  I have reviewed the triage vital signs and the nursing notes.  Pertinent labs & imaging results that were available during my care of the patient were reviewed by me and considered in my medical decision making (see chart for details).   22 year old male presents for headaches,  fatigue, chills, nausea/vomiting, abdominal pain, cough and sore  throat x 1 week.  Symptoms have improved minimally since onset.  No OTC meds taken.  Vitals are normal and stable and the patient is overall well-appearing.  On exam he is mild posterior pharyngeal erythema with 1+ bilateral enlarged tonsils and tenderness to palpation of the left upper quadrant and epigastric region mildly.  Remainder of exam is normal.  PCR strep test obtained.  Negative.  Reviewed results of strep test with patient.  Patient's symptoms likely due to viral infection.  Will treat symptoms at this time with Zofran and NSAIDs.  Also respiratory care with increasing rest and fluids.  Reviewed return and ER precautions.  Work note given.   Final Clinical Impressions(s) / UC Diagnoses   Final diagnoses:  Viral illness  Nausea and vomiting, unspecified vomiting type  Acute nonintractable headache, unspecified headache type  Sore throat     Discharge Instructions      URI/COLD SYMPTOMS: Your exam today is consistent with a viral illness. Antibiotics are not indicated at this time. Use medications as directed, including cough syrup, nasal saline, and decongestants. Your symptoms should improve over the next few days and resolve within 7-10 days. Increase rest and fluids. F/u if symptoms worsen or predominate such as sore throat, ear pain, productive cough, shortness of breath, or if you develop high fevers or worsening fatigue over the next several days.    ABDOMINAL PAIN: You may take Tylenol for pain relief. Use medications as directed including antiemetics and antidiarrheal medications if suggested or prescribed. You should increase fluids and electrolytes as well as rest over these next several days. If you have any questions or concerns, or if your symptoms are not improving or if especially if they acutely worsen, please call or stop back to the clinic immediately and we will be happy to help you or go to the  ER   ABDOMINAL PAIN RED FLAGS: Seek immediate further care if: symptoms remain the same or worsen over the next 3-7 days, you are unable to keep fluids down, you see blood or mucus in your stool, you vomit black or dark red material, you have a fever of 101.F or higher, you have localized and/or persistent abdominal pain       ED Prescriptions     Medication Sig Dispense Auth. Provider   ondansetron (ZOFRAN-ODT) 4 MG disintegrating tablet Take 1 tablet (4 mg total) by mouth every 6 (six) hours as needed for nausea or vomiting. 20 tablet Eusebio Friendly B, PA-C   naproxen (NAPROSYN) 375 MG tablet Take 1 tablet (375 mg total) by mouth 2 (two) times daily. 20 tablet Gareth Morgan      PDMP not reviewed this encounter.   Shirlee Latch, PA-C 02/19/23 [phone]

## 2023-02-19 NOTE — Discharge Instructions (Addendum)
URI/COLD SYMPTOMS: Your exam today is consistent with a viral illness. Antibiotics are not indicated at this time. Use medications as directed, including cough syrup, nasal saline, and decongestants. Your symptoms should improve over the next few days and resolve within 7-10 days. Increase rest and fluids. F/u if symptoms worsen or predominate such as sore throat, ear pain, productive cough, shortness of breath, or if you develop high fevers or worsening fatigue over the next several days.    ABDOMINAL PAIN: You may take Tylenol for pain relief. Use medications as directed including antiemetics and antidiarrheal medications if suggested or prescribed. You should increase fluids and electrolytes as well as rest over these next several days. If you have any questions or concerns, or if your symptoms are not improving or if especially if they acutely worsen, please call or stop back to the clinic immediately and we will be happy to help you or go to the ER   ABDOMINAL PAIN RED FLAGS: Seek immediate further care if: symptoms remain the same or worsen over the next 3-7 days, you are unable to keep fluids down, you see blood or mucus in your stool, you vomit black or dark red material, you have a fever of 101.F or higher, you have localized and/or persistent abdominal pain   

## 2024-03-20 ENCOUNTER — Emergency Department: Payer: Self-pay

## 2024-03-20 ENCOUNTER — Other Ambulatory Visit: Payer: Self-pay

## 2024-03-20 ENCOUNTER — Emergency Department
Admission: EM | Admit: 2024-03-20 | Discharge: 2024-03-20 | Disposition: A | Discharge status: Home / Self Care | Payer: Self-pay | Attending: Emergency Medicine | Admitting: Emergency Medicine

## 2024-03-20 DIAGNOSIS — J45909 Unspecified asthma, uncomplicated: Secondary | ICD-10-CM | POA: Diagnosis not present

## 2024-03-20 DIAGNOSIS — K5900 Constipation, unspecified: Secondary | ICD-10-CM | POA: Insufficient documentation

## 2024-03-20 DIAGNOSIS — R1084 Generalized abdominal pain: Secondary | ICD-10-CM

## 2024-03-20 DIAGNOSIS — D72829 Elevated white blood cell count, unspecified: Secondary | ICD-10-CM | POA: Insufficient documentation

## 2024-03-20 LAB — COMPREHENSIVE METABOLIC PANEL WITH GFR
ALT: 15 U/L (ref 0–44)
AST: 23 U/L (ref 15–41)
Albumin: 4.8 g/dL (ref 3.5–5.0)
Alkaline Phosphatase: 38 U/L (ref 38–126)
Anion gap: 14 (ref 5–15)
BUN: 20 mg/dL (ref 6–20)
CO2: 21 mmol/L — ABNORMAL LOW (ref 22–32)
Calcium: 9.7 mg/dL (ref 8.9–10.3)
Chloride: 104 mmol/L (ref 98–111)
Creatinine, Ser: 0.86 mg/dL (ref 0.61–1.24)
GFR, Estimated: >60 mL/min (ref >60)
Glucose, Bld: 140 mg/dL — ABNORMAL HIGH (ref 70–99)
Potassium: 3.6 mmol/L (ref 3.5–5.1)
Sodium: 139 mmol/L (ref 135–145)
Total Bilirubin: 1.2 mg/dL (ref 0.0–1.2)
Total Protein: 7.6 g/dL (ref 6.5–8.1)

## 2024-03-20 LAB — CBC WITH DIFFERENTIAL/PLATELET
Abs Immature Granulocytes: 0.04 K/uL (ref 0.00–0.07)
Basophils Absolute: 0.1 K/uL (ref 0.0–0.1)
Basophils Relative: 0 %
Eosinophils Absolute: 0.1 K/uL (ref 0.0–0.5)
Eosinophils Relative: 1 %
HCT: 44.1 % (ref 39.0–52.0)
Hemoglobin: 15.2 g/dL (ref 13.0–17.0)
Immature Granulocytes: 0 %
Lymphocytes Relative: 30 %
Lymphs Abs: 3.4 K/uL (ref 0.7–4.0)
MCH: 29.7 pg (ref 26.0–34.0)
MCHC: 34.5 g/dL (ref 30.0–36.0)
MCV: 86.1 fL (ref 80.0–100.0)
Monocytes Absolute: 0.7 K/uL (ref 0.1–1.0)
Monocytes Relative: 6 %
Neutro Abs: 7.1 K/uL (ref 1.7–7.7)
Neutrophils Relative %: 63 %
Platelets: 259 K/uL (ref 150–400)
RBC: 5.12 MIL/uL (ref 4.22–5.81)
RDW: 11.9 % (ref 11.5–15.5)
WBC: 11.4 K/uL — ABNORMAL HIGH (ref 4.0–10.5)
nRBC: 0 % (ref 0.0–0.2)

## 2024-03-20 MED ORDER — IOHEXOL 300 MG/ML  SOLN
100.0000 mL | Freq: Once | INTRAMUSCULAR | Status: AC | PRN
  Administered 2024-03-20: 100 mL via INTRAVENOUS

## 2024-03-20 MED ORDER — ONDANSETRON 4 MG PO TBDP: 4.0000 mg | ORAL_TABLET | Freq: Once | ORAL | Status: DC

## 2024-03-20 MED ORDER — ONDANSETRON HCL 4 MG/2ML IJ SOLN
4.0000 mg | Freq: Once | INTRAMUSCULAR | Status: AC
  Administered 2024-03-20: 4 mg via INTRAVENOUS
  Filled 2024-03-20: qty 2

## 2024-03-20 NOTE — Discharge Instructions (Addendum)
 Continue MiraLAX.  This will help to liquefy any stool that is remaining in the rectum.  You could also use a glycerin suppository as this helps to lubricate the rectum for the stool to pass through.  Return if you are worsening

## 2024-03-20 NOTE — ED Provider Notes (Signed)
 [facility] Provider Note    Event Date/Time   First MD Initiated Contact with Patient 03/20/24 1345     (approximate)   History   Constipation   HPI  Dale Mckee is a 23 y.o. male   presents to the ED with complaint of constipation for the last 2 weeks which was the last time he had a bowel movement.  Patient states he has tried fiber, MiraLAX, Dulcolax and increased water all without any relief.  He now complains of nausea.  While waiting in the emergency department he had a bowel movement and soiled his clothes.  He complains of having abdominal pain now along with nausea.  Patient denies any previous abdominal surgeries.  He has history of asthma.      Physical Exam   Triage Vital Signs: ED Triage Vitals  Encounter Vitals Group     BP 03/20/24 1209 (!) 139/90     Girls Systolic BP Percentile --      Girls Diastolic BP Percentile --      Boys Systolic BP Percentile --      Boys Diastolic BP Percentile --      Pulse Rate 03/20/24 1209 90     Resp 03/20/24 1209 20     Temp 03/20/24 1209 97.8 F (36.6 C)     Temp Source 03/20/24 1209 Oral     SpO2 03/20/24 1209 100 %     Weight --      Height 03/20/24 1207 5' 8 (1.727 m)     Head Circumference --      Peak Flow --      Pain Score 03/20/24 1207 10     Pain Loc --      Pain Education --      Exclude from Growth Chart --     Most recent vital signs: Vitals:   03/20/24 1209  BP: (!) 139/90  Pulse: 90  Resp: 20  Temp: 97.8 F (36.6 C)  SpO2: 100%     General: Awake, no distress.  Standing and walking around the exam room. CV:  Good peripheral perfusion.  Heart regular rate and rhythm. Resp:  Normal effort.  Lungs clear bilaterally. Abd:  No distention.  Other:  Abdomen soft with diffuse tenderness throughout.  Bowel sounds present x 4 quadrants.   ED Results / Procedures / Treatments   Labs (all labs ordered are listed, but only abnormal results are displayed) Labs  Reviewed  CBC WITH DIFFERENTIAL/PLATELET - Abnormal; Notable for the following components:      Result Value   WBC 11.4 (*)    All other components within normal limits  COMPREHENSIVE METABOLIC PANEL WITH GFR - Abnormal; Notable for the following components:   CO2 21 (*)    Glucose, Bld 140 (*)    All other components within normal limits     RADIOLOGY CT abdomen/pelvis with contrast    PROCEDURES:  Critical Care performed:   Procedures   MEDICATIONS ORDERED IN ED: Medications  ondansetron  (ZOFRAN ) injection 4 mg (4 mg Intravenous Given 03/20/24 1445)  iohexol (OMNIPAQUE) 300 MG/ML solution 100 mL (100 mLs Intravenous Contrast Given 03/20/24 1520)     IMPRESSION / MDM / ASSESSMENT AND PLAN / ED COURSE  I reviewed the triage vital signs and the nursing notes.   Differential diagnosis includes, but is not limited to, constipation, obstruction, ileus, diverticulitis, diverticulosis, generalized abdominal pain.  23 year old male presents to the ED with complaint of  constipation for the last 2 weeks.  Patient has taken over-the-counter medication and it was not until he arrived in the emergency department that he had a bowel movement.  He states that he has had several but now complains of severe generalized pain.  I explained his lab work to him however patient continued to complain about pain and a CT scan of his abdomen and pelvis was ordered.  ----------------------------------------- 3:32 PM on 03/20/2024 ----------------------------------------- At this time patient is being signed out to Devere Perry, PA-C.  Waiting on results of CT scan.       Patient's presentation is most consistent with acute illness / injury with system symptoms.  FINAL CLINICAL IMPRESSION(S) / ED DIAGNOSES   Final diagnoses:  Generalized abdominal pain  Constipation, unspecified constipation type     Rx / DC Orders   ED Discharge Orders     None        Note:  This document was  prepared using Dragon voice recognition software and may include unintentional dictation errors.   Saunders Shona CROME, PA-C 03/20/24 1533    Claudene Rover, MD 03/20/24 [phone]

## 2024-03-20 NOTE — ED Provider Notes (Signed)
  Physical Exam  BP (!) 139/90   Pulse 90   Temp 97.8 F (36.6 C) (Oral)   Resp 20   Ht 5' 8 (1.727 m)   SpO2 100%   BMI 31.93 kg/m   Physical Exam  Procedures  Procedures  ED Course / MDM    Medical Decision Making Amount and/or Complexity of Data Reviewed Radiology: ordered.  Risk Prescription drug management.   Care being assumed by me from Shona Gin, PA-C, in short awaiting CT.  No appendicitis discharge patient to home..  See original H&P  CT of the abdomen pelvis independently reviewed interpreted by me as having a stool ball about 6 cm.  Explained this to the patient.  It was unable to to be palpated on rectal exam previously.  Encouraged patient to continue MiraLAX, glycerin suppositories.  Feel at this time if he could continue this medications it would not soften it so he can pass it comfortably.  He states he understands.  Strict instructions to return if worsening.  Discharged stable condition.       Gasper Devere ORN, PA-C 03/20/24 1547    Claudene Rover, MD 03/20/24 [phone]

## 2024-03-20 NOTE — ED Triage Notes (Signed)
 Patient states constipation with last BM two weeks ago; has tried fiber, miralax, dulcolax and increased water.
